# Patient Record
Sex: Male | Born: 1995 | Race: Black or African American | Hispanic: No | Marital: Single
Health system: Southern US, Community
[De-identification: ages and names within clinical notes are randomized; demographics above are authoritative.]

## PROBLEM LIST (undated history)

## (undated) DIAGNOSIS — G039 Meningitis, unspecified: Secondary | ICD-10-CM

---
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
## 2015-08-18 IMAGING — CR DG KNEE COMPLETE 4+V*L*
4 series · 4 of 4 positions shown · non-contrast
Comparison: None.

CLINICAL DATA: Lateral patella pain, abrasions and swelling status
post football injury today.

EXAM:
LEFT KNEE - COMPLETE 4+ VIEW

[view not recorded (1 of 4)]
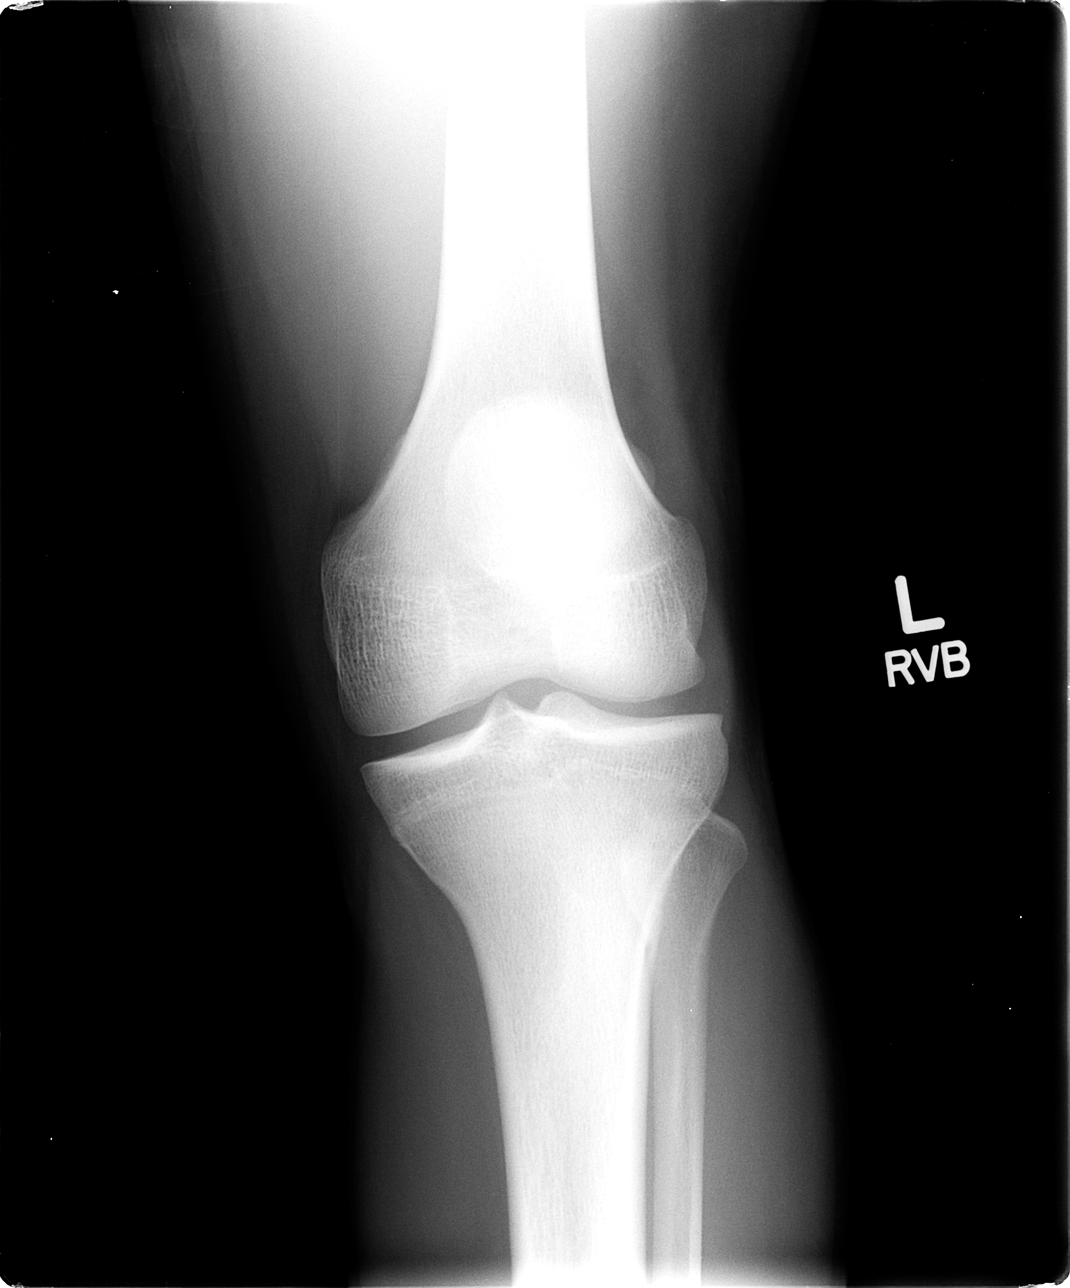

[view not recorded (2 of 4)]
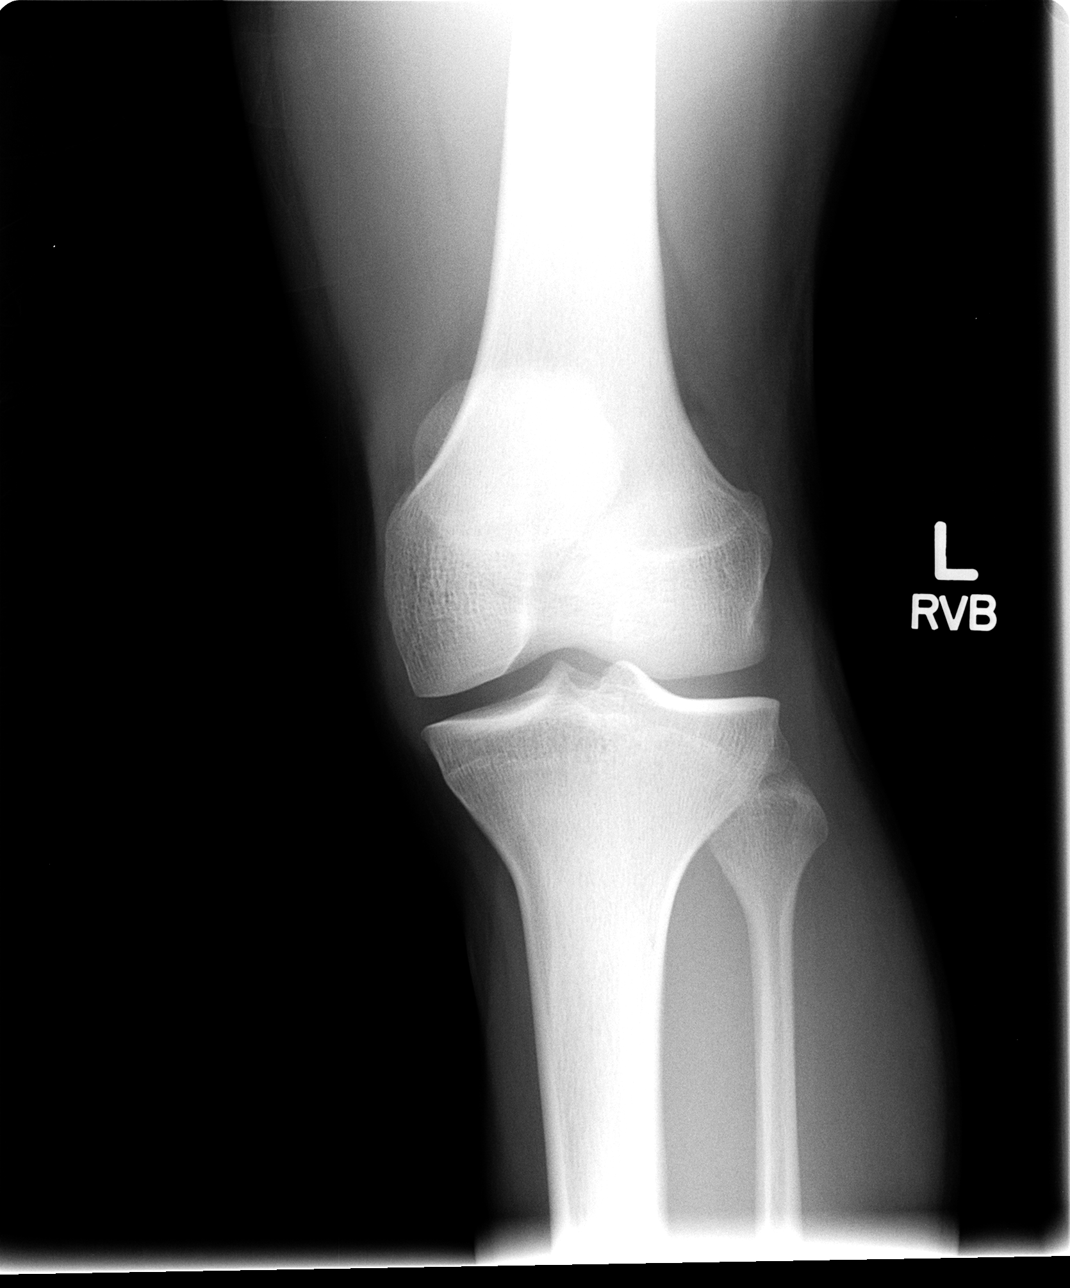

[view not recorded (3 of 4)]
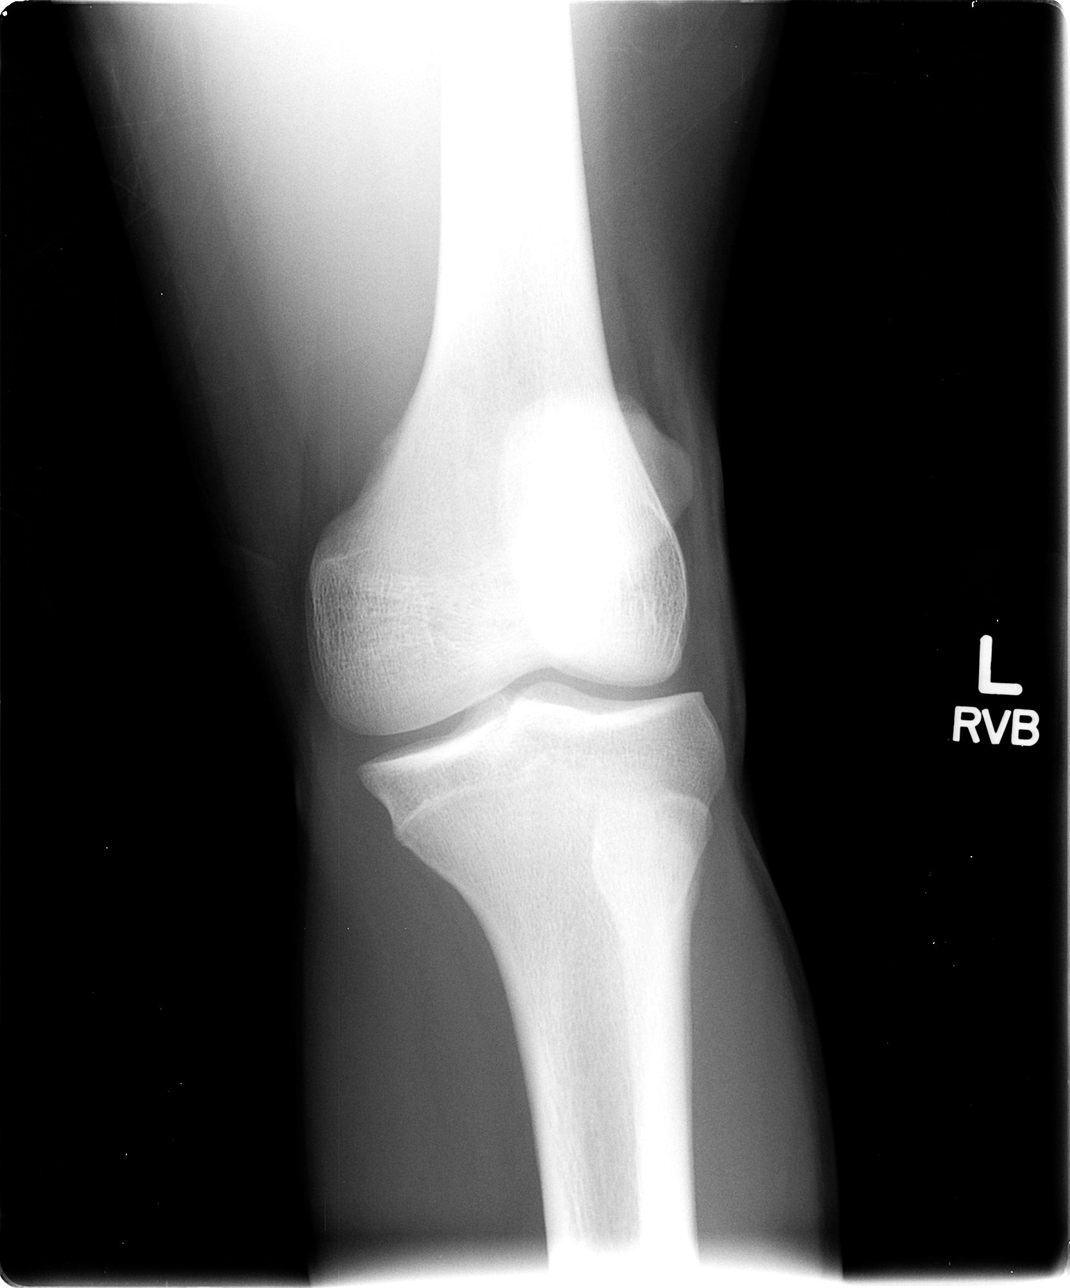

[view not recorded (4 of 4)]
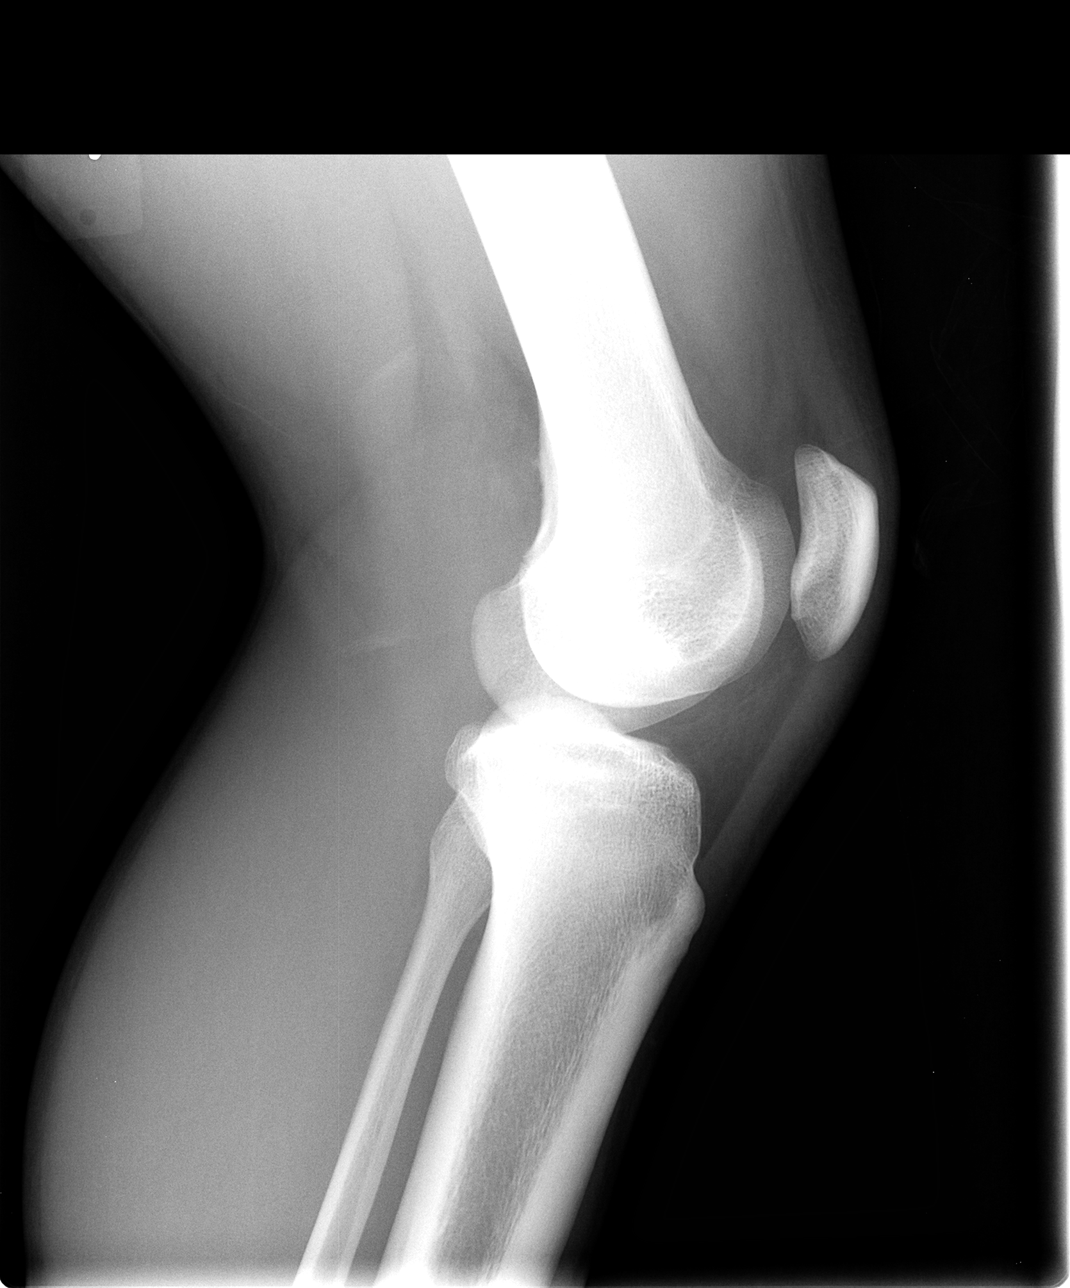

[4 of 4 positions shown; findings below may reference images not displayed]

FINDINGS: There is no evidence of fracture, dislocation, or joint effusion.
There is no evidence of arthropathy or other focal bone abnormality.
Soft tissues are unremarkable.
IMPRESSION: Negative.

## 2016-04-20 ENCOUNTER — Emergency Department (HOSPITAL_COMMUNITY)
Admission: EM | Admit: 2016-04-20 | Discharge: 2016-04-21 | Disposition: A | Discharge status: Home / Self Care | Payer: BLUE CROSS/BLUE SHIELD | Attending: Emergency Medicine | Admitting: Emergency Medicine

## 2016-04-20 ENCOUNTER — Emergency Department (HOSPITAL_COMMUNITY): Payer: BLUE CROSS/BLUE SHIELD

## 2016-04-20 ENCOUNTER — Encounter (HOSPITAL_COMMUNITY): Payer: Self-pay | Admitting: Emergency Medicine

## 2016-04-20 DIAGNOSIS — Y9367 Activity, basketball: Secondary | ICD-10-CM | POA: Insufficient documentation

## 2016-04-20 DIAGNOSIS — Y929 Unspecified place or not applicable: Secondary | ICD-10-CM | POA: Insufficient documentation

## 2016-04-20 DIAGNOSIS — Z7722 Contact with and (suspected) exposure to environmental tobacco smoke (acute) (chronic): Secondary | ICD-10-CM | POA: Diagnosis not present

## 2016-04-20 DIAGNOSIS — S93402A Sprain of unspecified ligament of left ankle, initial encounter: Secondary | ICD-10-CM | POA: Diagnosis not present

## 2016-04-20 DIAGNOSIS — W500XXA Accidental hit or strike by another person, initial encounter: Secondary | ICD-10-CM | POA: Insufficient documentation

## 2016-04-20 DIAGNOSIS — Z79899 Other long term (current) drug therapy: Secondary | ICD-10-CM | POA: Diagnosis not present

## 2016-04-20 DIAGNOSIS — S99912A Unspecified injury of left ankle, initial encounter: Secondary | ICD-10-CM | POA: Diagnosis present

## 2016-04-20 DIAGNOSIS — Y999 Unspecified external cause status: Secondary | ICD-10-CM | POA: Insufficient documentation

## 2016-04-20 MED ORDER — IBUPROFEN 600 MG PO TABS: 600.0000 mg | ORAL_TABLET | Freq: Four times a day (QID) | ORAL | Status: DC | PRN

## 2016-04-20 MED ORDER — IBUPROFEN 800 MG PO TABS
800.0000 mg | ORAL_TABLET | Freq: Once | ORAL | Status: AC
  Administered 2016-04-21: 800 mg via ORAL
  Filled 2016-04-20: qty 1

## 2016-04-20 MED ORDER — ACETAMINOPHEN 325 MG PO TABS
650.0000 mg | ORAL_TABLET | Freq: Once | ORAL | Status: AC
  Administered 2016-04-21: 650 mg via ORAL
  Filled 2016-04-20: qty 2

## 2016-04-20 NOTE — Discharge Instructions (Signed)
Please use the ankle splint for the next 10 days. Apply ice and elevate the left ankle. Ankle Sprain An ankle sprain is an injury to the strong, fibrous tissues (ligaments) that hold your ankle bones together.  HOME CARE   Put ice on your ankle for 1-2 days or as told by your doctor.  Put ice in a plastic bag.  Place a towel between your skin and the bag.  Leave the ice on for 15-20 minutes at a time, every 2 hours while you are awake.  Only take medicine as told by your doctor.  Raise (elevate) your injured ankle above the level of your heart as much as possible for 2-3 days.  Use crutches if your doctor tells you to. Slowly put your own weight on the affected ankle. Use the crutches until you can walk without pain.  If you have a plaster splint:  Do not rest it on anything harder than a pillow for 24 hours.  Do not put weight on it.  Do not get it wet.  Take it off to shower or bathe.  If given, use an elastic wrap or support stocking for support. Take the wrap off if your toes lose feeling (numb), tingle, or turn cold or blue.  If you have an air splint:  Add or let out air to make it comfortable.  Take it off at night and to shower and bathe.  Wiggle your toes and move your ankle up and down often while you are wearing it. GET HELP IF:  You have rapidly increasing bruising or puffiness (swelling).  Your toes feel very cold.  You lose feeling in your foot.  Your medicine does not help your pain. GET HELP RIGHT AWAY IF:   Your toes lose feeling (numb) or turn blue.  You have severe pain that is increasing. MAKE SURE YOU:   Understand these instructions.  Will watch your condition.  Will get help right away if you are not doing well or get worse.   This information is not intended to replace advice given to you by your health care provider. Make sure you discuss any questions you have with your health care provider.   Document Released: 03/16/2008  Document Revised: 10/19/2014 Document Reviewed: 04/11/2012 Elsevier Interactive Patient Education [employer]2016 [employer]vier Inc.

## 2016-04-20 NOTE — ED Notes (Signed)
Pt was playing basketball when another player came down on his L. Ankle. Pt has moderate amount of swelling to L. Ankle.

## 2016-04-20 NOTE — ED Provider Notes (Signed)
CSN: [id number]     Arrival date & time 04/20/16  2122 History   First MD Initiated Contact with Patient 04/20/16 2304     Chief Complaint  Patient presents with  . Ankle Pain     (Consider location/radiation/quality/duration/timing/severity/associated sxs/prior Treatment) Patient is a 20 y.o. male presenting with ankle pain. The history is provided by the patient.  Ankle Pain Location:  Ankle Time since incident: earlier today. Injury: yes   Mechanism of injury comment:  Someone came down on his ankle during basketball Ankle location:  L ankle Pain details:    Quality:  Aching and throbbing   Radiates to:  Does not radiate   Severity:  Moderate   Onset quality:  Sudden   Timing:  Constant   Progression:  Worsening Chronicity:  New Dislocation: no   Prior injury to area:  No Relieved by:  Nothing Worsened by:  Bearing weight Associated symptoms: decreased ROM   Associated symptoms: no back pain, no neck pain and no numbness     Past Medical History  Diagnosis Date  . Meningitis spinal     childhood   History reviewed. No pertinent past surgical history. Family History  Problem Relation Age of Onset  . Heart disease    . Arthritis    . Cancer    . Asthma    . Diabetes    . Kidney disease    . Early death Maternal Grandmother 1    sudden cardiac death   Social History  Substance Use Topics  . Smoking status: Passive Smoke Exposure - Never Smoker  . Smokeless tobacco: None  . Alcohol Use: No    Review of Systems  Constitutional: Negative for activity change.       All ROS Neg except as noted in HPI  HENT: Negative for nosebleeds.   Eyes: Negative for photophobia and discharge.  Respiratory: Negative for cough, shortness of breath and wheezing.   Cardiovascular: Negative for chest pain and palpitations.  Gastrointestinal: Negative for abdominal pain and blood in stool.  Genitourinary: Negative for dysuria, frequency and hematuria.  Musculoskeletal:  Negative for back pain, arthralgias and neck pain.  Skin: Negative.   Neurological: Negative for dizziness, seizures and speech difficulty.  Psychiatric/Behavioral: Negative for hallucinations and confusion.      Allergies  Review of patient's allergies indicates no known allergies.  Home Medications   Prior to Admission medications   Medication Sig Start Date End Date Taking? Authorizing Provider  cephALEXin (KEFLEX) 500 MG capsule Take 1 capsule (500 mg total) by mouth 2 (two) times daily. 10/24/13   Hope Orlene Och, NP  Multiple Vitamin (MULTIVITAMIN) tablet Take 1 tablet by mouth daily.    Historical Provider, MD   BP 115/75 mmHg  Pulse 76  Temp(Src) 98.6 F (37 C) (Oral)  Resp 22  Ht  (1.651 m)  Wt 72.576 kg  BMI 26.63 kg/m2  SpO2 99% Physical Exam  Constitutional: He is oriented to person, place, and time. He appears well-developed and well-nourished.  Non-toxic appearance.  HENT:  Head: Normocephalic.  Right Ear: Tympanic membrane and external ear normal.  Left Ear: Tympanic membrane and external ear normal.  Eyes: EOM and lids are normal. Pupils are equal, round, and reactive to light.  Neck: Normal range of motion. Neck supple. Carotid bruit is not present.  Cardiovascular: Normal rate, regular rhythm, normal heart sounds, intact distal pulses and normal pulses.   Pulmonary/Chest: Breath sounds normal. No respiratory distress.  Abdominal: Soft.  Bowel sounds are normal. There is no tenderness. There is no guarding.  Musculoskeletal:       Left ankle: He exhibits decreased range of motion. Tenderness. Lateral malleolus tenderness found.  Lymphadenopathy:       Head (right side): No submandibular adenopathy present.       Head (left side): No submandibular adenopathy present.    He has no cervical adenopathy.  Neurological: He is alert and oriented to person, place, and time. He has normal strength. No cranial nerve deficit or sensory deficit.  Skin: Skin is warm  and dry.  Psychiatric: He has a normal mood and affect. His speech is normal.  Nursing note and vitals reviewed.   ED Course  Procedures (including critical care time) Labs Review Labs Reviewed - No data to display  Imaging Review Dg Ankle Complete Left  04/20/2016  CLINICAL DATA:  20 year old male with trauma to the left ankle. EXAM: LEFT ANKLE COMPLETE - 3+ VIEW COMPARISON:  None. FINDINGS: There is no acute fracture or dislocation. The bones are well mineralized. The ankle mortise is intact. There is soft tissue swelling over the lateral malleolus. No radiopaque foreign object or soft tissue gas. IMPRESSION: No acute osseous pathology. Electronically Signed   By: Elgie CollardArash  Radparvar M.D.   On: 04/20/2016 22:15   I have personally reviewed and evaluated these images and lab results as part of my medical decision-making.   EKG Interpretation None      MDM  Xray of the left ankle is negative for fracture or dislocation. Exam suggest ankle sprain. Pt fitted with aso and crutches.   Final diagnoses:  Ankle sprain, left, initial encounter    **I have reviewed nursing notes, vital signs, and all appropriate lab and imaging results for this patient.Ivery Quale*    Andriy Sherk, PA-C 04/20/16 2357  Devoria AlbeIva Knapp, MD 04/21/16 [phone]

## 2016-07-01 ENCOUNTER — Ambulatory Visit: Payer: BLUE CROSS/BLUE SHIELD | Admitting: Orthopaedic Surgery

## 2018-04-06 IMAGING — DX DG ANKLE COMPLETE 3+V*L*
3 series · 3 of 3 positions shown · non-contrast
Comparison: None.

CLINICAL DATA: 20-year-old male with trauma to the left ankle.

EXAM:
LEFT ANKLE COMPLETE - 3+ VIEW

[ankle ap]
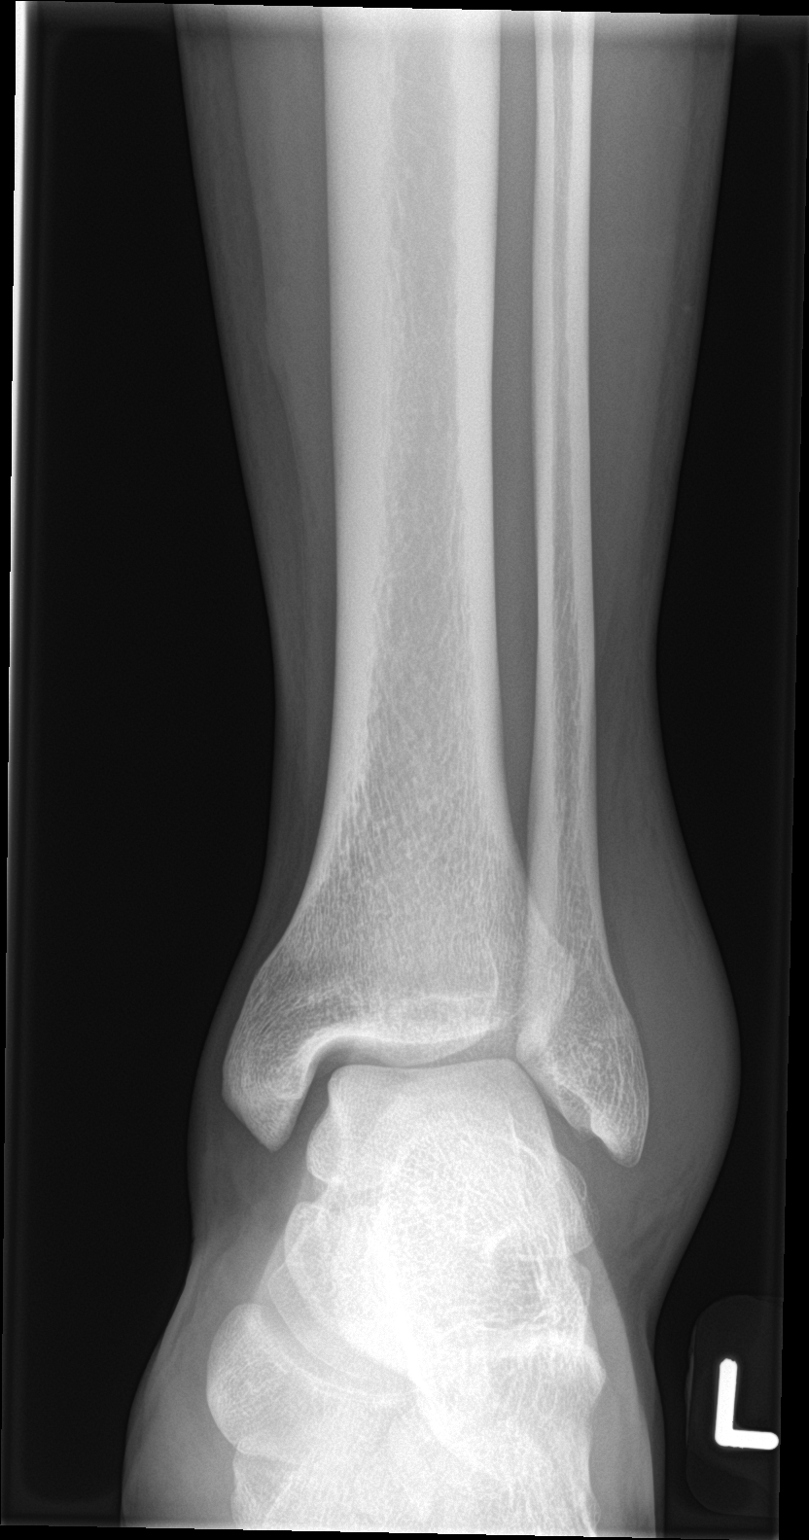

[ankle obl]
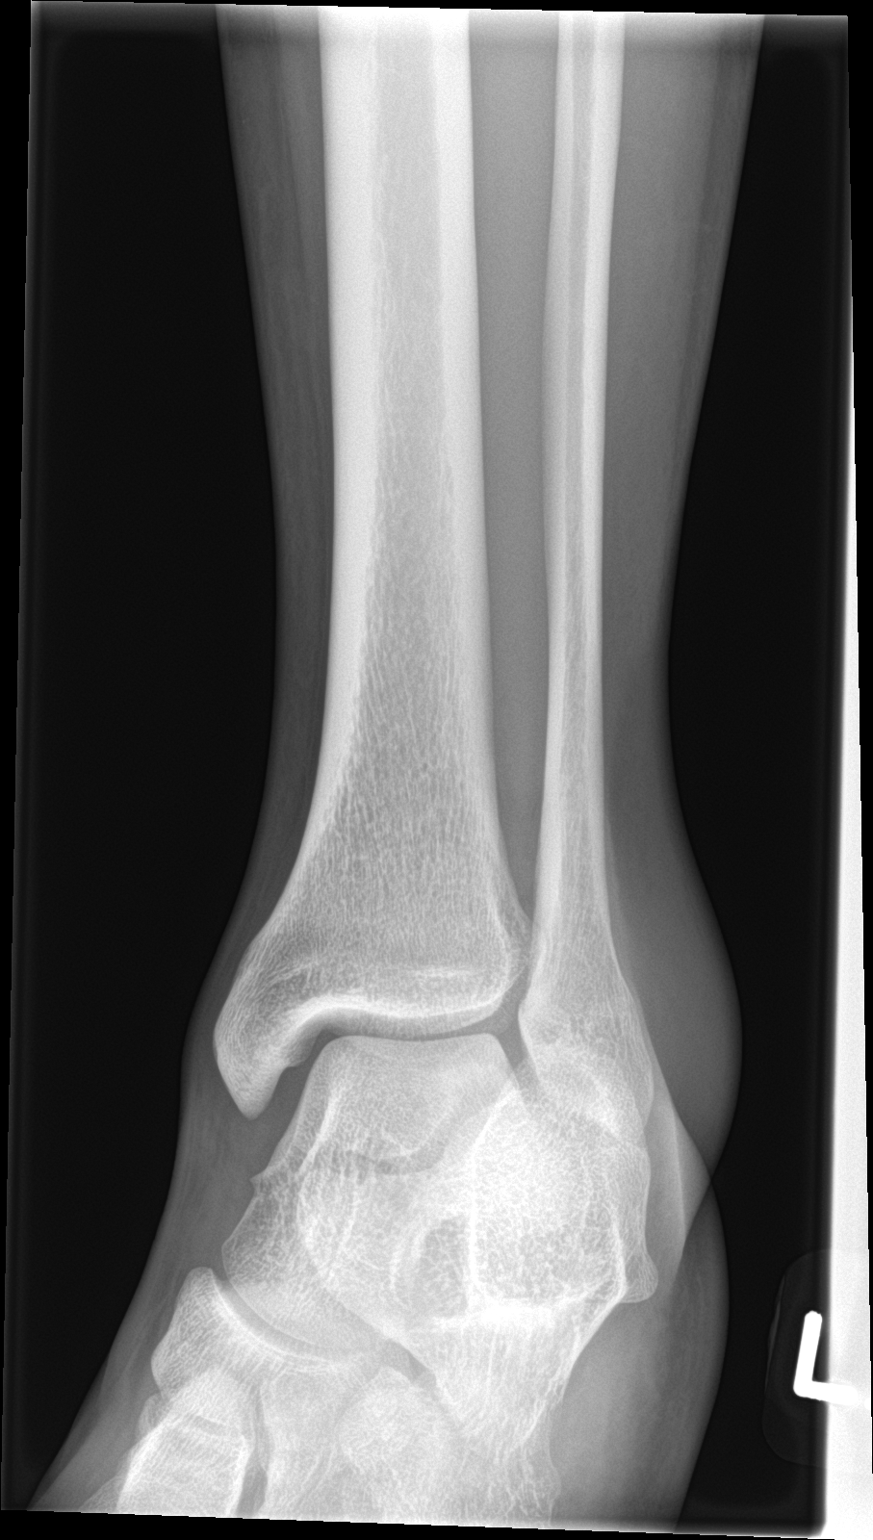

[ankle lat]
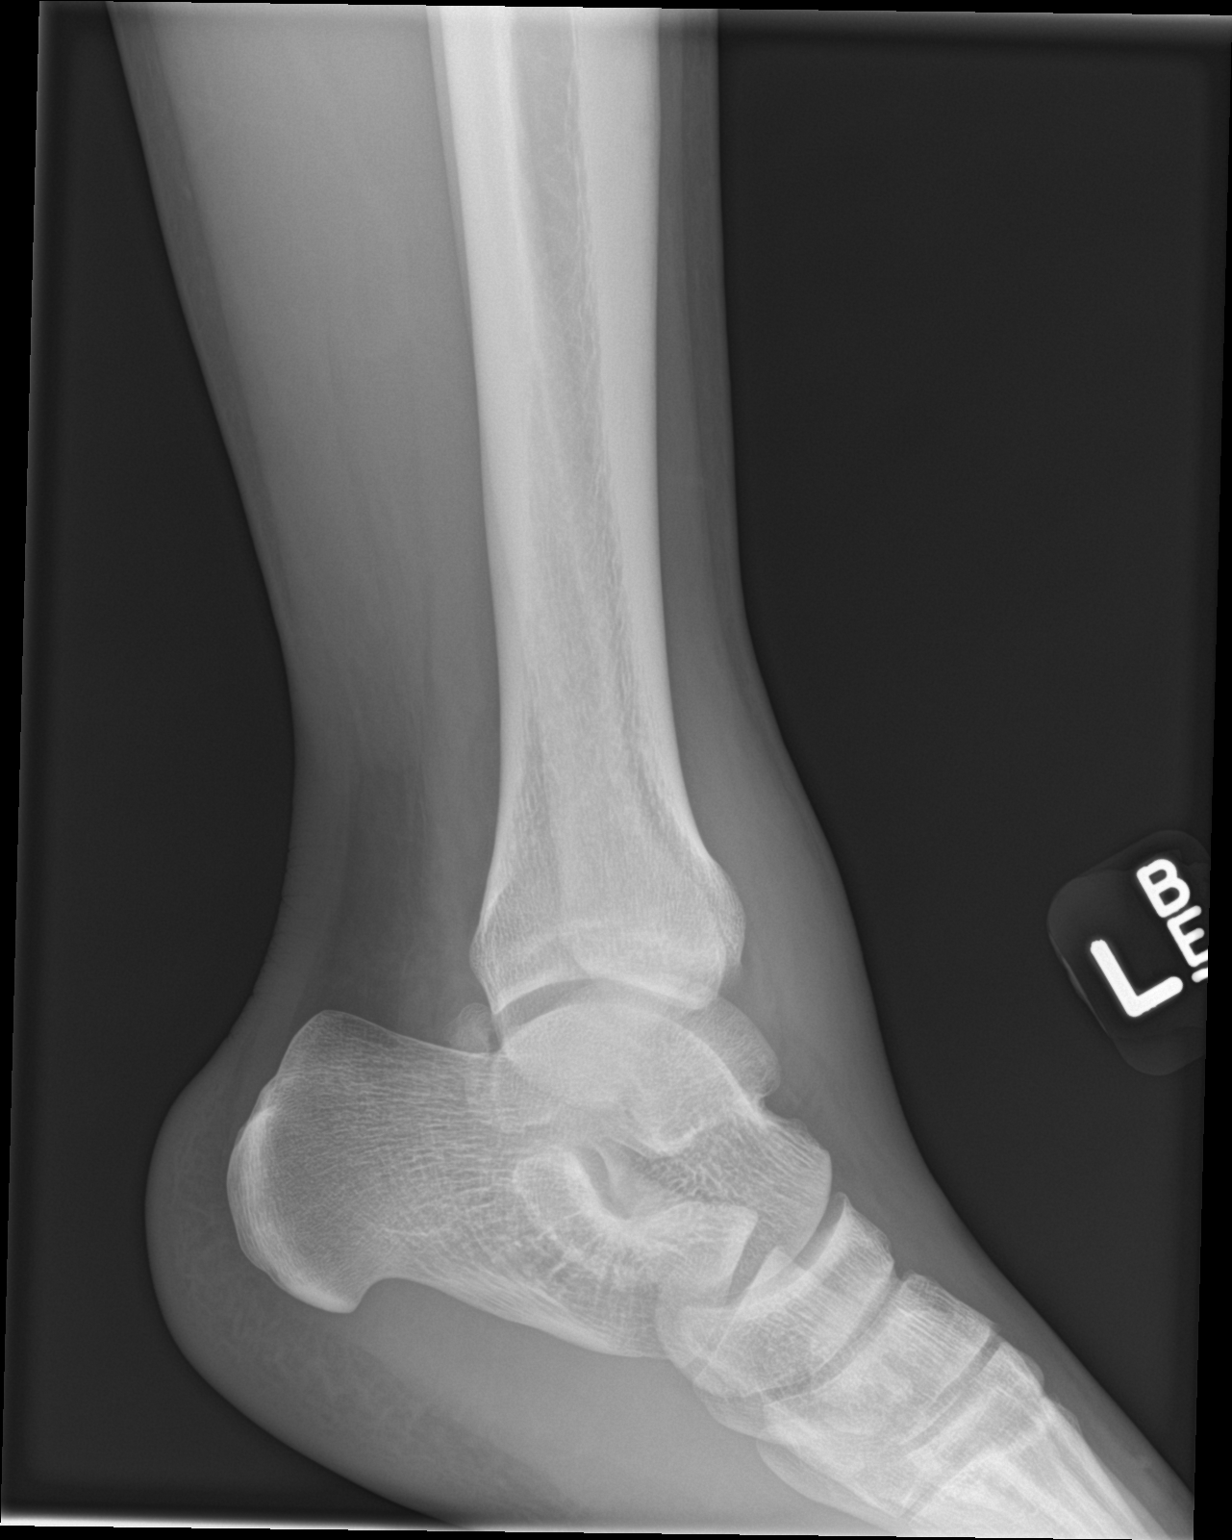

[3 of 3 positions shown; findings below may reference images not displayed]

FINDINGS: There is no acute fracture or dislocation. The bones are well
mineralized. The ankle mortise is intact. There is soft tissue
swelling over the lateral malleolus. No radiopaque foreign object or
soft tissue gas.
IMPRESSION: No acute osseous pathology.

## 2019-08-14 ENCOUNTER — Other Ambulatory Visit: Payer: Self-pay

## 2019-08-14 DIAGNOSIS — Z20822 Contact with and (suspected) exposure to covid-19: Secondary | ICD-10-CM

## 2019-08-14 NOTE — Progress Notes (Unsigned)
lab

## 2019-08-15 LAB — NOVEL CORONAVIRUS, NAA: SARS-CoV-2, NAA: NOT DETECTED

## 2019-08-18 ENCOUNTER — Other Ambulatory Visit: Payer: Self-pay

## 2019-08-18 DIAGNOSIS — Z20822 Contact with and (suspected) exposure to covid-19: Secondary | ICD-10-CM

## 2019-08-19 LAB — NOVEL CORONAVIRUS, NAA: SARS-CoV-2, NAA: NOT DETECTED

## 2019-09-29 ENCOUNTER — Ambulatory Visit: Payer: BLUE CROSS/BLUE SHIELD | Attending: Internal Medicine

## 2019-09-29 DIAGNOSIS — Z20822 Contact with and (suspected) exposure to covid-19: Secondary | ICD-10-CM

## 2019-09-30 LAB — NOVEL CORONAVIRUS, NAA: SARS-CoV-2, NAA: NOT DETECTED

## 2020-06-04 ENCOUNTER — Other Ambulatory Visit: Payer: Self-pay

## 2020-06-05 ENCOUNTER — Other Ambulatory Visit: Payer: BLUE CROSS/BLUE SHIELD

## 2021-09-03 ENCOUNTER — Other Ambulatory Visit: Payer: Self-pay

## 2021-09-03 ENCOUNTER — Emergency Department (HOSPITAL_COMMUNITY)
Admission: EM | Admit: 2021-09-03 | Discharge: 2021-09-03 | Disposition: A | Discharge status: Home / Self Care | Payer: BLUE CROSS/BLUE SHIELD | Attending: Emergency Medicine | Admitting: Emergency Medicine

## 2021-09-03 DIAGNOSIS — Z20822 Contact with and (suspected) exposure to covid-19: Secondary | ICD-10-CM | POA: Insufficient documentation

## 2021-09-03 DIAGNOSIS — J101 Influenza due to other identified influenza virus with other respiratory manifestations: Secondary | ICD-10-CM | POA: Insufficient documentation

## 2021-09-03 DIAGNOSIS — Z7722 Contact with and (suspected) exposure to environmental tobacco smoke (acute) (chronic): Secondary | ICD-10-CM | POA: Insufficient documentation

## 2021-09-03 LAB — RESP PANEL BY RT-PCR (FLU A&B, COVID) ARPGX2
Influenza A by PCR: POSITIVE — AB
Influenza B by PCR: NEGATIVE
SARS Coronavirus 2 by RT PCR: NEGATIVE

## 2021-09-03 MED ORDER — IBUPROFEN 800 MG PO TABS: 800.0000 mg | ORAL_TABLET | Freq: Three times a day (TID) | ORAL | 0 refills | Status: AC

## 2021-09-03 MED ORDER — OSELTAMIVIR PHOSPHATE 75 MG PO CAPS: 75.0000 mg | ORAL_CAPSULE | Freq: Two times a day (BID) | ORAL | 0 refills | Status: AC

## 2021-09-03 MED ORDER — HYDROCOD POLST-CPM POLST ER 10-8 MG/5ML PO SUER: 5.0000 mL | Freq: Two times a day (BID) | ORAL | 0 refills | Status: AC | PRN

## 2021-09-03 MED ORDER — IBUPROFEN 800 MG PO TABS
800.0000 mg | ORAL_TABLET | Freq: Once | ORAL | Status: AC
  Administered 2021-09-03: 800 mg via ORAL
  Filled 2021-09-03: qty 1

## 2021-09-03 NOTE — ED Triage Notes (Signed)
Cough (productively clear), HA, fever (subjective) x2 days.

## 2021-09-03 NOTE — ED Provider Notes (Signed)
[facility] EMERGENCY DEPARTMENT Provider Note   CSN: [id number] Arrival date & time: 09/03/21  1907     History Chief Complaint  Patient presents with   Cough    Shawn Bradley is a 25 y.o. male.  Pt presents to the ED today with cough, headache, and fever.  Pt has had sx for 2 days.  He has been exposed to the flu.  He has not taken anything for his sx.      Past Medical History:  Diagnosis Date   Meningitis spinal    childhood    Patient Active Problem List   Diagnosis Date Noted   Knee MCL sprain 09/06/2013   Sprain of metacarpophalangeal joint of right thumb 07/20/2012   Pain of right thumb 07/19/2012    No past surgical history on file.     Family History  Problem Relation Age of Onset   Heart disease Other    Arthritis Other    Cancer Other    Asthma Other    Diabetes Other    Kidney disease Other    Early death Maternal Grandmother 2       sudden cardiac death    Social History   Tobacco Use   Smoking status: Passive Smoke Exposure - Never Smoker  Substance Use Topics   Alcohol use: No   Drug use: No    Home Medications Prior to Admission medications   Medication Sig Start Date End Date Taking? Authorizing Provider  chlorpheniramine-HYDROcodone (TUSSIONEX PENNKINETIC ER) 10-8 MG/5ML SUER Take 5 mLs by mouth every 12 (twelve) hours as needed for cough. 09/03/21  Yes Jacalyn Lefevre, MD  ibuprofen (ADVIL) 800 MG tablet Take 1 tablet (800 mg total) by mouth 3 (three) times daily. 09/03/21  Yes Jacalyn Lefevre, MD  oseltamivir (TAMIFLU) 75 MG capsule Take 1 capsule (75 mg total) by mouth every 12 (twelve) hours. 09/03/21  Yes Jacalyn Lefevre, MD  cephALEXin (KEFLEX) 500 MG capsule Take 1 capsule (500 mg total) by mouth 2 (two) times daily. 10/24/13   Janne Napoleon, NP  Multiple Vitamin (MULTIVITAMIN) tablet Take 1 tablet by mouth daily.    [provider]    Allergies    Patient has no known allergies.  Review of Systems   Review  of Systems  Constitutional:  Positive for fever.  Respiratory:  Positive for cough.   Neurological:  Positive for headaches.  All other systems reviewed and are negative.  Physical Exam Updated Vital Signs BP 118/70 (BP Location: Left Arm)   Pulse 90   Temp (!) 101.2 F (38.4 C) (Oral)   Resp 20   Ht 5\' 5"  (1.651 m)   Wt 77.1 kg   SpO2 99%   BMI 28.29 kg/m   Physical Exam Vitals and nursing note reviewed.  Constitutional:      Appearance: Normal appearance.  HENT:     Head: Normocephalic and atraumatic.     Right Ear: External ear normal.     Left Ear: External ear normal.     Nose: Nose normal.     Mouth/Throat:     Mouth: Mucous membranes are moist.     Pharynx: Oropharynx is clear.  Eyes:     Extraocular Movements: Extraocular movements intact.     Conjunctiva/sclera: Conjunctivae normal.     Pupils: Pupils are equal, round, and reactive to light.  Cardiovascular:     Rate and Rhythm: Normal rate and regular rhythm.     Pulses: Normal pulses.  Heart sounds: Normal heart sounds.  Pulmonary:     Effort: Pulmonary effort is normal.     Breath sounds: Normal breath sounds.  Abdominal:     General: Abdomen is flat. Bowel sounds are normal.     Palpations: Abdomen is soft.  Musculoskeletal:        General: Normal range of motion.     Cervical back: Normal range of motion and neck supple.  Skin:    General: Skin is warm.     Capillary Refill: Capillary refill takes less than 2 seconds.  Neurological:     General: No focal deficit present.     Mental Status: He is alert and oriented to person, place, and time.  Psychiatric:        Mood and Affect: Mood normal.        Behavior: Behavior normal.    ED Results / Procedures / Treatments   Labs (all labs ordered are listed, but only abnormal results are displayed) Labs Reviewed  RESP PANEL BY RT-PCR (FLU A&B, COVID) ARPGX2 - Abnormal; Notable for the following components:      Result Value   Influenza A by  PCR POSITIVE (*)    All other components within normal limits    EKG None  Radiology No results found.  Procedures Procedures   Medications Ordered in ED Medications  ibuprofen (ADVIL) tablet 800 mg (has no administration in time range)    ED Course  I have reviewed the triage vital signs and the nursing notes.  Pertinent labs & imaging results that were available during my care of the patient were reviewed by me and considered in my medical decision making (see chart for details).    MDM Rules/Calculators/A&P                           Pt is + for the flu.  He is d/c with a rx for tamiflu, ibuprofen, and tussionex.  He is not hypoxic and looks nontoxic.  Pt is stable for d/c.  Return if worse.  F/u with pcp. Final Clinical Impression(s) / ED Diagnoses Final diagnoses:  Influenza A    Rx / DC Orders ED Discharge Orders          Ordered    oseltamivir (TAMIFLU) 75 MG capsule  Every 12 hours        09/03/21 2235    ibuprofen (ADVIL) 800 MG tablet  3 times daily        09/03/21 2235    chlorpheniramine-HYDROcodone (TUSSIONEX PENNKINETIC ER) 10-8 MG/5ML SUER  Every 12 hours PRN        09/03/21 2235             Jacalyn Lefevre, MD 09/03/21 2253
